# Patient Record
Sex: Female | Born: 1995 | Race: Black or African American | Hispanic: No | Marital: Married | Smoking: Never smoker
Health system: Southern US, Community
[De-identification: ages and names within clinical notes are randomized; demographics above are authoritative.]

## PROBLEM LIST (undated history)

## (undated) DIAGNOSIS — O210 Mild hyperemesis gravidarum: Secondary | ICD-10-CM

## (undated) HISTORY — PX: EYE SURGERY: SHX253

---

## 2018-01-23 ENCOUNTER — Emergency Department (HOSPITAL_BASED_OUTPATIENT_CLINIC_OR_DEPARTMENT_OTHER)
Admission: EM | Admit: 2018-01-23 | Discharge: 2018-01-23 | Disposition: A | Discharge status: Home / Self Care | Payer: Medicaid Other | Attending: Emergency Medicine | Admitting: Emergency Medicine

## 2018-01-23 ENCOUNTER — Encounter (HOSPITAL_BASED_OUTPATIENT_CLINIC_OR_DEPARTMENT_OTHER): Payer: Self-pay | Admitting: *Deleted

## 2018-01-23 ENCOUNTER — Other Ambulatory Visit: Payer: Self-pay

## 2018-01-23 DIAGNOSIS — O210 Mild hyperemesis gravidarum: Secondary | ICD-10-CM | POA: Diagnosis not present

## 2018-01-23 DIAGNOSIS — Z3A09 9 weeks gestation of pregnancy: Secondary | ICD-10-CM | POA: Insufficient documentation

## 2018-01-23 MED ORDER — ONDANSETRON 4 MG PO TBDP: ORAL_TABLET | ORAL | 0 refills | Status: AC

## 2018-01-23 MED ORDER — SODIUM CHLORIDE 0.9 % IV BOLUS
1000.0000 mL | Freq: Once | INTRAVENOUS | Status: AC
  Administered 2018-01-23: 1000 mL via INTRAVENOUS

## 2018-01-23 MED ORDER — SUCRALFATE 1 G PO TABS
1.0000 g | ORAL_TABLET | Freq: Once | ORAL | Status: AC
  Administered 2018-01-23: 1 g via ORAL
  Filled 2018-01-23: qty 1

## 2018-01-23 MED ORDER — DIPHENHYDRAMINE HCL 50 MG/ML IJ SOLN
25.0000 mg | Freq: Once | INTRAMUSCULAR | Status: AC
  Administered 2018-01-23: 25 mg via INTRAVENOUS
  Filled 2018-01-23: qty 1

## 2018-01-23 MED ORDER — METOCLOPRAMIDE HCL 5 MG/ML IJ SOLN
10.0000 mg | Freq: Once | INTRAMUSCULAR | Status: AC
  Administered 2018-01-23: 10 mg via INTRAVENOUS
  Filled 2018-01-23: qty 2

## 2018-01-23 MED ORDER — ONDANSETRON HCL 4 MG/2ML IJ SOLN
4.0000 mg | Freq: Once | INTRAMUSCULAR | Status: AC
  Administered 2018-01-23: 4 mg via INTRAVENOUS
  Filled 2018-01-23: qty 2

## 2018-01-23 NOTE — ED Triage Notes (Signed)
[redacted] weeks pregnant. Here with vomiting. No pain. She has had an US which showed a healthy implant.

## 2018-01-23 NOTE — Discharge Instructions (Signed)
Go to a drugstore and buy unisom and vitaminb6(pyridoxine) Take 1/2 a tab of unisom(12.5mg ) and 25mg  of vitamin b6.  Take this at night before bed.  If you continue to have nausea and vomiting take twice a day.  [facility] or [facility].  Try to eat small frequent meals.  Follow up with your OB.

## 2018-01-23 NOTE — ED Provider Notes (Signed)
[facility] EMERGENCY DEPARTMENT Provider Note   CSN: [id number] Arrival date & time: 01/23/18  2157     History   Chief Complaint Chief Complaint  Patient presents with  . Emesis  . [redacted] Weeks Pregnant    HPI Bethany Hart is a 22 y.o. female.  22 yoF with a chief complaint of nausea and vomiting.  The patient has had an issue with this for some time.  This is her seventh visit to a medical professional for this complaint during this pregnancy.  She is about [redacted] weeks pregnant now.  Had an IUP that was documented about 6 days ago.  She denies vaginal bleeding or discharge denies dysuria increased frequency or flank pain.  She feels that she cannot keep anything down.  When I questioned her about frequent meals she states she has not been on the keep anything down so has not tried frequent meals.  Her OB/GYN's notes that they prescribed her likely just asked her how this is working for her and she is that she has not yet had that filled.  The history is provided by the patient.  Emesis   This is a new problem. The current episode started more than 1 week ago. The problem occurs 2 to 4 times per day. The problem has not changed since onset.There has been no fever. Pertinent negatives include no arthralgias, no chills, no fever, no headaches and no myalgias.    History reviewed. No pertinent past medical history.  There are no active problems to display for this patient.   History reviewed. No pertinent surgical history.   OB History    Gravida  1   Para      Term      Preterm      AB      Living        SAB      TAB      Ectopic      Multiple      Live Births               Home Medications    Prior to Admission medications   Medication Sig Start Date End Date Taking? Authorizing Provider  ondansetron (ZOFRAN ODT) 4 MG disintegrating tablet 4mg  ODT q4 hours prn nausea/vomit 01/23/18   Melene PlanFloyd, Raghad Lorenz, DO    Family History No family history on  file.  Social History Social History   Tobacco Use  . Smoking status: Never Smoker  . Smokeless tobacco: Never Used  Substance Use Topics  . Alcohol use: Never    Frequency: Never  . Drug use: Never     Allergies   Patient has no known allergies.   Review of Systems Review of Systems  Constitutional: Negative for chills and fever.  HENT: Negative for congestion and rhinorrhea.   Eyes: Negative for redness and visual disturbance.  Respiratory: Negative for shortness of breath and wheezing.   Cardiovascular: Negative for chest pain and palpitations.  Gastrointestinal: Positive for nausea and vomiting.  Genitourinary: Negative for dysuria and urgency.  Musculoskeletal: Negative for arthralgias and myalgias.  Skin: Negative for pallor and wound.  Neurological: Negative for dizziness and headaches.     Physical Exam Updated Vital Signs BP 112/79 (BP Location: Left Arm)   Pulse 86   Temp 98.6 F (37 C) (Oral)   Resp 20   Ht 4\' 11"  (1.499 m)   LMP 11/21/2017   SpO2 100%   Physical Exam  Constitutional: She  is oriented to person, place, and time. She appears well-developed and well-nourished. No distress.  HENT:  Head: Normocephalic and atraumatic.  Eyes: Pupils are equal, round, and reactive to light. EOM are normal.  Neck: Normal range of motion. Neck supple.  Cardiovascular: Normal rate and regular rhythm. Exam reveals no gallop and no friction rub.  No murmur heard. Pulmonary/Chest: Effort normal. She has no wheezes. She has no rales.  Abdominal: Soft. She exhibits no distension and no mass. There is no tenderness. There is no guarding.  Musculoskeletal: She exhibits no edema or tenderness.  Neurological: She is alert and oriented to person, place, and time.  Skin: Skin is warm and dry. She is not diaphoretic.  Psychiatric: She has a normal mood and affect. Her behavior is normal.  Nursing note and vitals reviewed.    ED Treatments / Results  Labs (all  labs ordered are listed, but only abnormal results are displayed) Labs Reviewed - No data to display  EKG None  Radiology No results found.  Procedures Procedures (including critical care time)  Medications Ordered in ED Medications  sodium chloride 0.9 % bolus 1,000 mL ( Intravenous Stopped 01/23/18 2334)  metoCLOPramide (REGLAN) injection 10 mg (10 mg Intravenous Given 01/23/18 2234)  diphenhydrAMINE (BENADRYL) injection 25 mg (25 mg Intravenous Given 01/23/18 2236)  ondansetron (ZOFRAN) injection 4 mg (4 mg Intravenous Given 01/23/18 2305)  sucralfate (CARAFATE) tablet 1 g (1 g Oral Given 01/23/18 2327)     Initial Impression / Assessment and Plan / ED Course  I have reviewed the triage vital signs and the nursing notes.  Pertinent labs & imaging results that were available during my care of the patient were reviewed by me and considered in my medical decision making (see chart for details).     22 yo F with a chief complaint of hyperemesis gravidarum.  This is the patient's seventh visit for this.  We will give Reglan and Benadryl and IV fluids and reassess.  Nausea improved, tolerating PO.  OB follow up.   11:36 PM:  I have discussed the diagnosis/risks/treatment options with the patient and believe the pt to be eligible for discharge home to follow-up with OB. We also discussed returning to the ED immediately if new or worsening sx occur. We discussed the sx which are most concerning (e.g., inability to eat or drink) that necessitate immediate return. Medications administered to the patient during their visit and any new prescriptions provided to the patient are listed below.  Medications given during this visit Medications  sodium chloride 0.9 % bolus 1,000 mL ( Intravenous Stopped 01/23/18 2334)  metoCLOPramide (REGLAN) injection 10 mg (10 mg Intravenous Given 01/23/18 2234)  diphenhydrAMINE (BENADRYL) injection 25 mg (25 mg Intravenous Given 01/23/18 2236)  ondansetron  (ZOFRAN) injection 4 mg (4 mg Intravenous Given 01/23/18 2305)  sucralfate (CARAFATE) tablet 1 g (1 g Oral Given 01/23/18 2327)      The patient appears reasonably screen and/or stabilized for discharge and I doubt any other medical condition or other [facility] requiring further screening, evaluation, or treatment in the ED at this time prior to discharge.    Final Clinical Impressions(s) / ED Diagnoses   Final diagnoses:  Hyperemesis gravidarum    ED Discharge Orders         Ordered    ondansetron (ZOFRAN ODT) 4 MG disintegrating tablet     01/23/18 2334           Melene PlanFloyd, Chau Sawin, DO 01/23/18 2336

## 2018-02-06 ENCOUNTER — Emergency Department (HOSPITAL_BASED_OUTPATIENT_CLINIC_OR_DEPARTMENT_OTHER): Payer: Medicaid Other

## 2018-02-06 ENCOUNTER — Other Ambulatory Visit: Payer: Self-pay

## 2018-02-06 ENCOUNTER — Encounter (HOSPITAL_BASED_OUTPATIENT_CLINIC_OR_DEPARTMENT_OTHER): Payer: Self-pay | Admitting: *Deleted

## 2018-02-06 ENCOUNTER — Emergency Department (HOSPITAL_BASED_OUTPATIENT_CLINIC_OR_DEPARTMENT_OTHER)
Admission: EM | Admit: 2018-02-06 | Discharge: 2018-02-07 | Disposition: A | Discharge status: Home / Self Care | Payer: Medicaid Other | Attending: Emergency Medicine | Admitting: Emergency Medicine

## 2018-02-06 DIAGNOSIS — R1011 Right upper quadrant pain: Secondary | ICD-10-CM | POA: Diagnosis not present

## 2018-02-06 DIAGNOSIS — Z79899 Other long term (current) drug therapy: Secondary | ICD-10-CM | POA: Insufficient documentation

## 2018-02-06 DIAGNOSIS — R1013 Epigastric pain: Secondary | ICD-10-CM

## 2018-02-06 DIAGNOSIS — Z3A09 9 weeks gestation of pregnancy: Secondary | ICD-10-CM | POA: Diagnosis not present

## 2018-02-06 DIAGNOSIS — O26891 Other specified pregnancy related conditions, first trimester: Secondary | ICD-10-CM | POA: Insufficient documentation

## 2018-02-06 LAB — COMPREHENSIVE METABOLIC PANEL
ALBUMIN: 4.5 g/dL (ref 3.5–5.0)
ALT: 14 U/L (ref 0–44)
AST: 21 U/L (ref 15–41)
Alkaline Phosphatase: 56 U/L (ref 38–126)
Anion gap: 12 (ref 5–15)
BILIRUBIN TOTAL: 0.7 mg/dL (ref 0.3–1.2)
BUN: 10 mg/dL (ref 6–20)
CHLORIDE: 101 mmol/L (ref 98–111)
CO2: 22 mmol/L (ref 22–32)
CREATININE: 0.66 mg/dL (ref 0.44–1.00)
Calcium: 10 mg/dL (ref 8.9–10.3)
GFR calc Af Amer: >60 mL/min (ref >60)
GFR calc non Af Amer: >60 mL/min (ref >60)
GLUCOSE: 95 mg/dL (ref 70–99)
Potassium: 3.6 mmol/L (ref 3.5–5.1)
Sodium: 135 mmol/L (ref 135–145)
Total Protein: 8.4 g/dL — ABNORMAL HIGH (ref 6.5–8.1)

## 2018-02-06 LAB — URINALYSIS, ROUTINE W REFLEX MICROSCOPIC
BILIRUBIN URINE: NEGATIVE
Glucose, UA: NEGATIVE mg/dL
KETONES UR: 40 mg/dL — AB
Leukocytes, UA: NEGATIVE
NITRITE: NEGATIVE
Protein, ur: NEGATIVE mg/dL
Specific Gravity, Urine: >1.03 — ABNORMAL HIGH (ref 1.005–1.030)
pH: 6 (ref 5.0–8.0)

## 2018-02-06 LAB — CBC
HCT: 37 % (ref 36.0–46.0)
Hemoglobin: 13.1 g/dL (ref 12.0–15.0)
MCH: 29.5 pg (ref 26.0–34.0)
MCHC: 35.4 g/dL (ref 30.0–36.0)
MCV: 83.3 fL (ref 78.0–100.0)
PLATELETS: 337 10*3/uL (ref 150–400)
RBC: 4.44 MIL/uL (ref 3.87–5.11)
RDW: 12.1 % (ref 11.5–15.5)
WBC: 7.2 10*3/uL (ref 4.0–10.5)

## 2018-02-06 LAB — URINALYSIS, MICROSCOPIC (REFLEX)

## 2018-02-06 LAB — LIPASE, BLOOD: LIPASE: 36 U/L (ref 11–51)

## 2018-02-06 MED ORDER — SODIUM CHLORIDE 0.9 % IV BOLUS
1000.0000 mL | Freq: Once | INTRAVENOUS | Status: AC
Start: 2018-02-06 — End: 2018-02-06
  Administered 2018-02-06: 1000 mL via INTRAVENOUS

## 2018-02-06 MED ORDER — ONDANSETRON HCL 4 MG/2ML IJ SOLN
4.0000 mg | Freq: Once | INTRAMUSCULAR | Status: DC | PRN
  Filled 2018-02-06: qty 2

## 2018-02-06 MED ORDER — SUCRALFATE 1 G PO TABS: 1.0000 g | ORAL_TABLET | Freq: Three times a day (TID) | ORAL | 0 refills | Status: DC

## 2018-02-06 MED ORDER — LANSOPRAZOLE 15 MG PO CPDR: 15.0000 mg | DELAYED_RELEASE_CAPSULE | Freq: Every day | ORAL | 0 refills | Status: DC

## 2018-02-06 MED ORDER — SODIUM CHLORIDE 0.9 % IV BOLUS
1000.0000 mL | Freq: Once | INTRAVENOUS | Status: AC
  Administered 2018-02-06: 1000 mL via INTRAVENOUS

## 2018-02-06 MED ORDER — SUCRALFATE 1 G PO TABS
1.0000 g | ORAL_TABLET | Freq: Once | ORAL | Status: AC
  Administered 2018-02-06: 1 g via ORAL
  Filled 2018-02-06: qty 1

## 2018-02-06 NOTE — ED Triage Notes (Signed)
[redacted] weeks pregnant. Abdominal pain that feels like a pushing in her mid abdomen. Nausea. She has had hyperemesis with the pregnancy.

## 2018-02-06 NOTE — ED Provider Notes (Signed)
[facility] EMERGENCY DEPARTMENT Provider Note   CSN: [id number] Arrival date & time: 02/06/18  1842     History   Chief Complaint Chief Complaint  Patient presents with  . Abdominal Pain    HPI Bethany Hart is a 22 y.o. female.  HPI Bethany Hart is a 22 y.o. female G1, P0, 9 weeks and 3 days gestation by ultrasound yesterday, presents to emergency department complaining of epigastric pain, nausea, vomiting.  Patient states that her symptoms started few weeks ago.  She states that really got worse in the last several days.  She was seen yesterday at [facility], and was diagnosed with gastritis and started on Pepcid.  She did not take any Pepcid at home but had a dose in the hospital which helped.  She states that she is unable to keep anything down at home.  She states pain comes and goes, mainly in epigastric area but radiates into the right flank.  She denies any urinary symptoms.  No vaginal discharge or bleeding.  She had blood work done yesterday which was all normal as well as urinalysis and pelvic ultrasound which showed no problems with her pregnancy.  She states she feels dehydrated.  History reviewed. No pertinent past medical history.  There are no active problems to display for this patient.   Past Surgical History:  Procedure Laterality Date  . EYE SURGERY       OB History    Gravida  1   Para      Term      Preterm      AB      Living        SAB      TAB      Ectopic      Multiple      Live Births               Home Medications    Prior to Admission medications   Medication Sig Start Date End Date Taking? Authorizing Provider  ondansetron (ZOFRAN ODT) 4 MG disintegrating tablet 4mg  ODT q4 hours prn nausea/vomit 01/23/18   Melene Plan, DO    Family History No family history on file.  Social History Social History   Tobacco Use  . Smoking status: Never Smoker  . Smokeless tobacco: Never  Used  Substance Use Topics  . Alcohol use: Never    Frequency: Never  . Drug use: Never     Allergies   Patient has no known allergies.   Review of Systems Review of Systems  Constitutional: Negative for chills and fever.  Respiratory: Negative for cough, chest tightness and shortness of breath.   Cardiovascular: Negative for chest pain, palpitations and leg swelling.  Gastrointestinal: Positive for abdominal pain, nausea and vomiting. Negative for diarrhea.  Genitourinary: Negative for dysuria, flank pain, pelvic pain, vaginal bleeding, vaginal discharge and vaginal pain.  Musculoskeletal: Negative for arthralgias, myalgias, neck pain and neck stiffness.  Skin: Negative for rash.  Neurological: Negative for dizziness, weakness and headaches.  All other systems reviewed and are negative.    Physical Exam Updated Vital Signs BP 116/69 (BP Location: Right Arm)   Pulse (!) 109   Temp 98.4 F (36.9 C) (Oral)   Resp 20   Ht 4\' 11"  (1.499 m)   Wt 50.3 kg   LMP 11/21/2017   SpO2 100%   BMI 22.40 kg/m   Physical Exam  Constitutional: She appears well-developed and well-nourished. No distress.  HENT:  Head: Normocephalic.  Eyes: Conjunctivae are normal.  Neck: Neck supple.  Cardiovascular: Normal rate, regular rhythm and normal heart sounds.  Pulmonary/Chest: Effort normal and breath sounds normal. No respiratory distress. She has no wheezes. She has no rales.  Abdominal: Soft. Bowel sounds are normal. She exhibits no distension. There is tenderness. There is no rebound.  Epigastric and right upper quadrant tenderness  Musculoskeletal: She exhibits no edema.  Neurological: She is alert.  Skin: Skin is warm and dry.  Psychiatric: She has a normal mood and affect. Her behavior is normal.  Nursing note and vitals reviewed.    ED Treatments / Results  Labs (all labs ordered are listed, but only abnormal results are displayed) Labs Reviewed  URINALYSIS, ROUTINE W  REFLEX MICROSCOPIC - Abnormal; Notable for the following components:      Result Value   APPearance CLOUDY (*)    Specific Gravity, Urine >1.030 (*)    Hgb urine dipstick LARGE (*)    Ketones, ur 40 (*)    All other components within normal limits  URINALYSIS, MICROSCOPIC (REFLEX) - Abnormal; Notable for the following components:   Bacteria, UA RARE (*)    All other components within normal limits  COMPREHENSIVE METABOLIC PANEL - Abnormal; Notable for the following components:   Total Protein 8.4 (*)    All other components within normal limits  LIPASE, BLOOD  CBC    EKG None  Radiology US Abdomen Limited Ruq  Result Date: 02/06/2018 CLINICAL DATA:  Right upper quadrant pain, nausea, vomiting EXAM: ULTRASOUND ABDOMEN LIMITED RIGHT UPPER QUADRANT COMPARISON:  None. FINDINGS: Gallbladder: No gallstones or wall thickening visualized. No sonographic Murphy sign noted by sonographer. Common bile duct: Diameter: Normal caliber, 1 mm Liver: No focal lesion identified. Within normal limits in parenchymal echogenicity. Portal vein is patent on color Doppler imaging with normal direction of blood flow towards the liver. IMPRESSION: Normal right upper quadrant ultrasound. Electronically Signed   By: Charlett Nose M.D.   On: 02/06/2018 23:16    Procedures Procedures (including critical care time)  Medications Ordered in ED Medications  ondansetron (ZOFRAN) injection 4 mg (has no administration in time range)  sodium chloride 0.9 % bolus 1,000 mL (has no administration in time range)  sodium chloride 0.9 % bolus 1,000 mL (has no administration in time range)     Initial Impression / Assessment and Plan / ED Course  I have reviewed the triage vital signs and the nursing notes.  Pertinent labs & imaging results that were available during my care of the patient were reviewed by me and considered in my medical decision making (see chart for details).     Patient with persistent nausea  vomiting and epigastric pain.  She is 9 weeks and 3 days pregnant.  Had ultrasound and blood work done yesterday which is all unremarkable.  Blood work repeated here today in triage and is negative as well including LFTs and lipase.  She is very tender in the right upper quadrant, will get ultrasound of her gallbladder today.  I will give her IV fluids.  Her urine does look concentrated with 40 ketones.  Her urine however does not show any signs of infection.  11:49 PM Ultrasound the gallbladder is negative.  Patient is feeling better.  She is drinking with no difficulty.  She is still having epigastric pain.  I tried Carafate which did not help.  Was started on ranitidine by [facility] yesterday which she states when  she took only helped for about an hour.  I will add Prevacid and Carafate to her medicines at home.  She has Zofran that she has been taking it helps her with vomiting.  We discussed food choices.  Discussed following up with primary care doctor.  Instructed to her to come back if she is feeling worse.  Patient agreed.  At this time I do not think she needs admission.  She is drinking.  She is in no acute distress, vital signs are normal, stable for discharge home.  Vitals:   02/06/18 1849 02/06/18 1852 02/06/18 2057 02/06/18 2337  BP:  (!) 116/104 116/69 112/70  Pulse:  (!) 120 (!) 109 95  Resp:  18 20 16   Temp:  98.4 F (36.9 C)    TempSrc:  Oral    SpO2:  99% 100% 100%  Weight: 50.3 kg     Height: 4\' 11"  (1.499 m)        Final Clinical Impressions(s) / ED Diagnoses   Final diagnoses:  RUQ pain  Epigastric pain    ED Discharge Orders         Ordered    sucralfate (CARAFATE) 1 g tablet  3 times daily with meals & bedtime     02/06/18 2352    lansoprazole (PREVACID) 15 MG capsule  Daily     02/06/18 2352           Jaynie CrumbleKirichenko, Alexyss Balzarini, PA-C 02/06/18 2356    Vanetta MuldersZackowski, Scott, MD 02/12/18 [id number]

## 2018-02-06 NOTE — Discharge Instructions (Addendum)
Continue ranitidine that was prescribed to you by [facility], take Prevacid and Carafate in addition.  You can take Maalox for acute symptoms.  Avoid any spicy or tomato-based foods.  Follow-up with OB/GYN if not improving. Return if worsening symptoms.

## 2019-09-16 IMAGING — US US ABDOMEN LIMITED
1 series · 14 of 25 positions shown · non-contrast
Comparison: None.

CLINICAL DATA: Right upper quadrant pain, nausea, vomiting

EXAM:
ULTRASOUND ABDOMEN LIMITED RIGHT UPPER QUADRANT

[Series 1: us abdomen limited · 0.14mm/px · 14 of 40 slices shown]
[im 1/40]
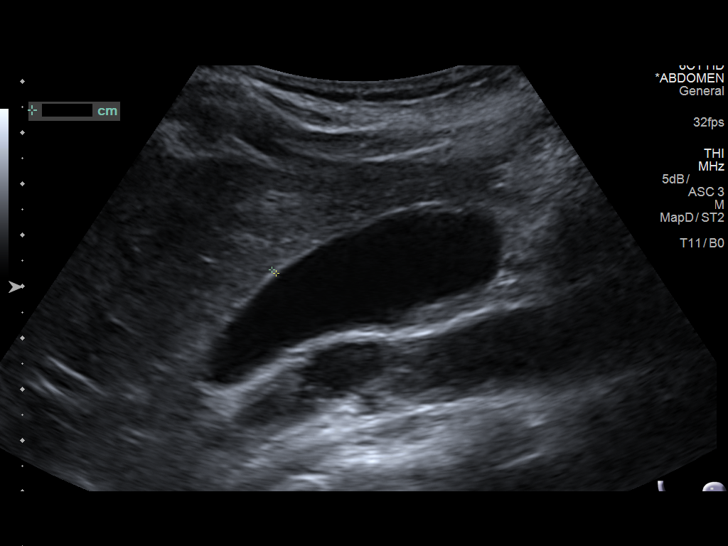
[im 4/40]
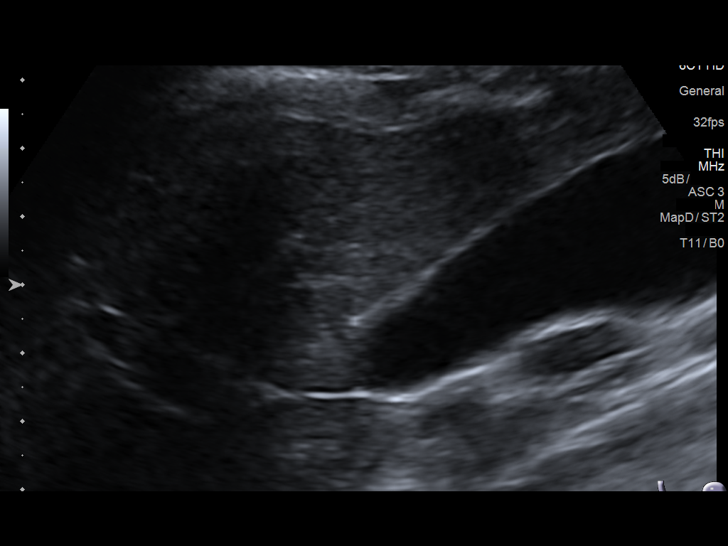
[im 7/40]
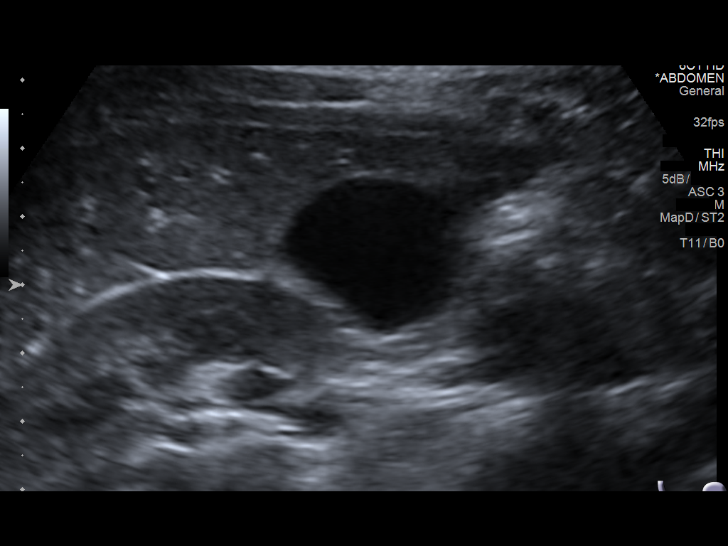
[im 10/40]
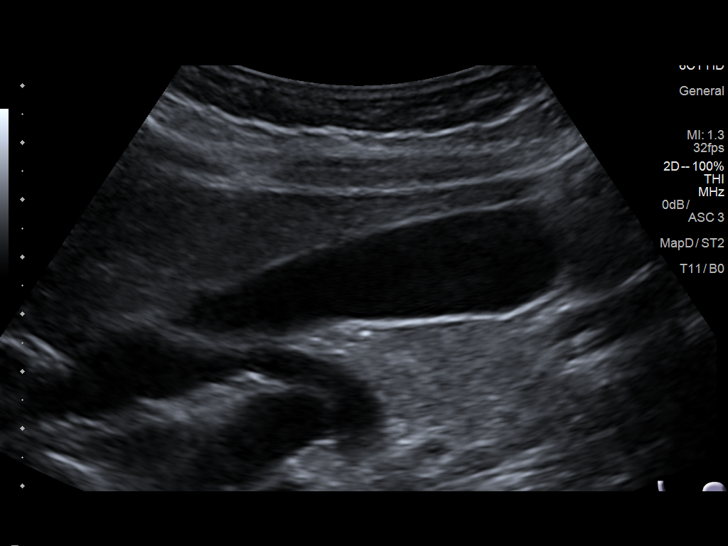
[im 14/40]
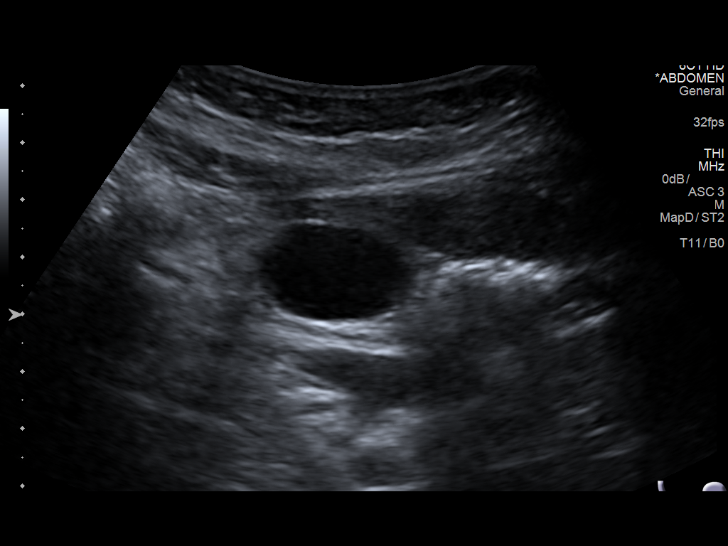
[im 15/40]
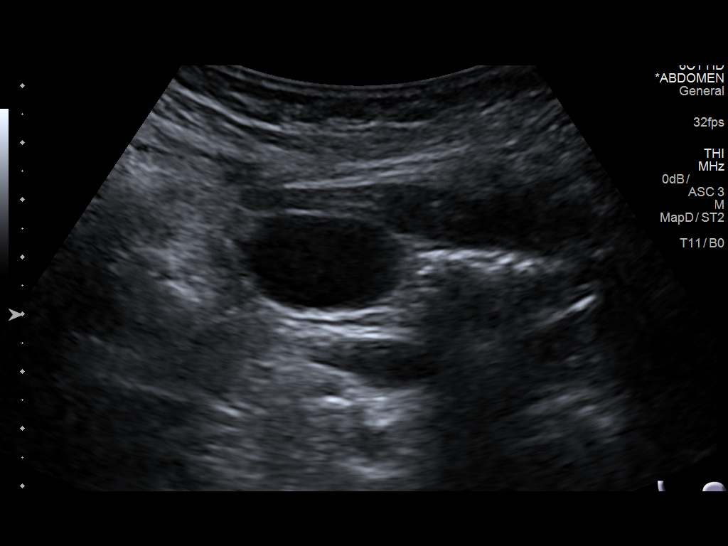
[im 18/40]
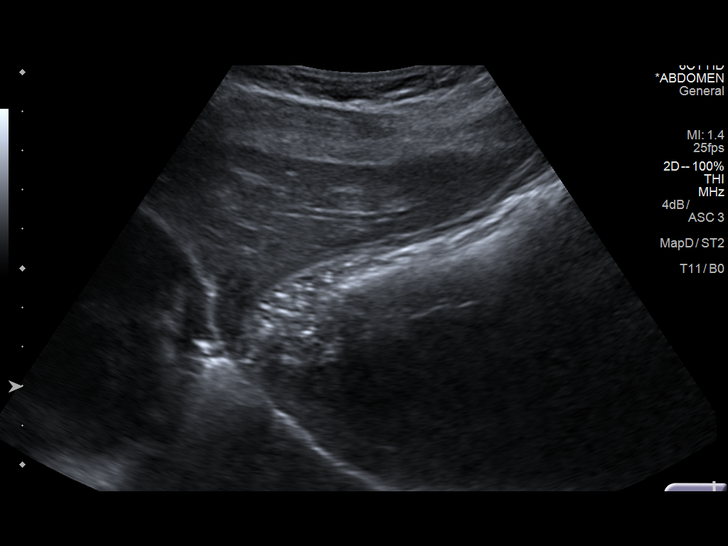
[im 22/40]
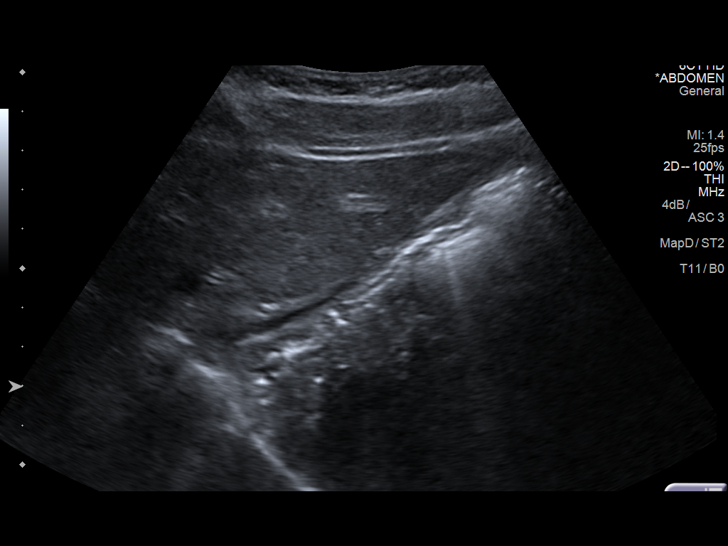
[im 25/40]
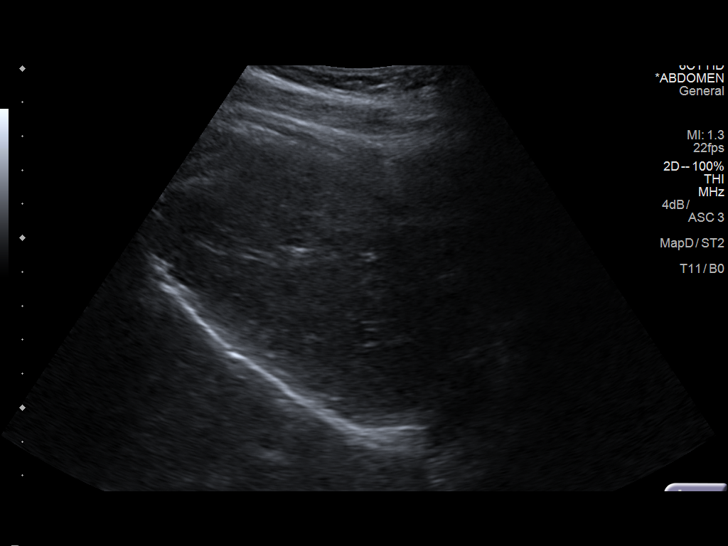
[im 27/40]
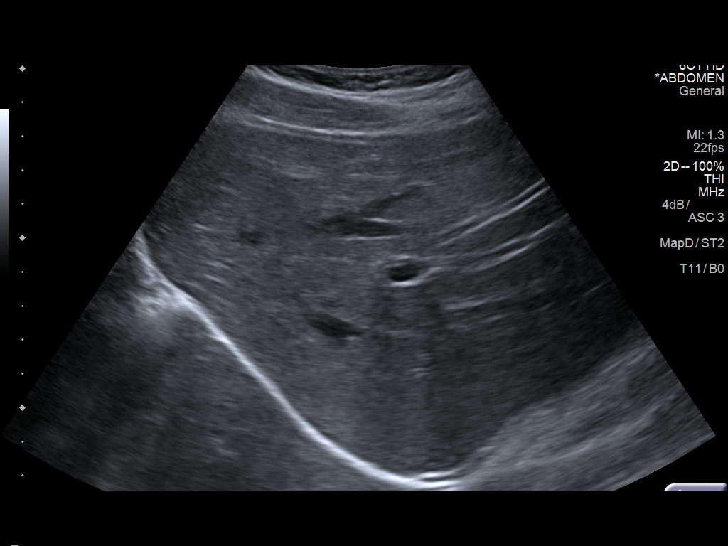
[im 30/40]
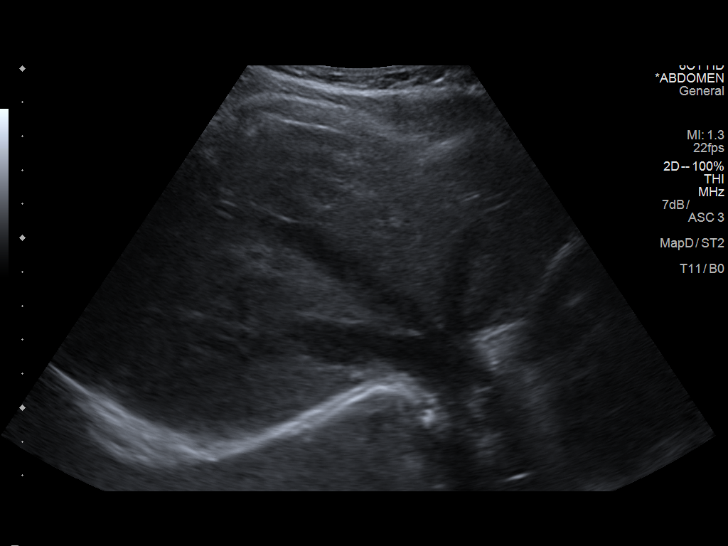
[im 33/40]
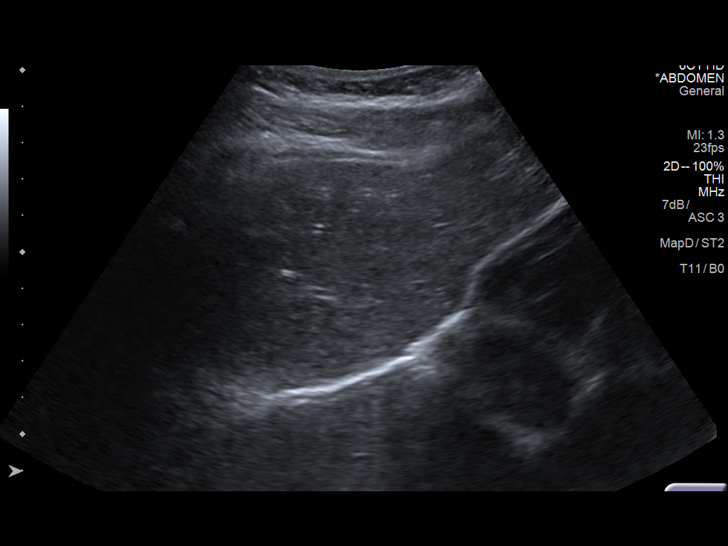
[im 36/40]
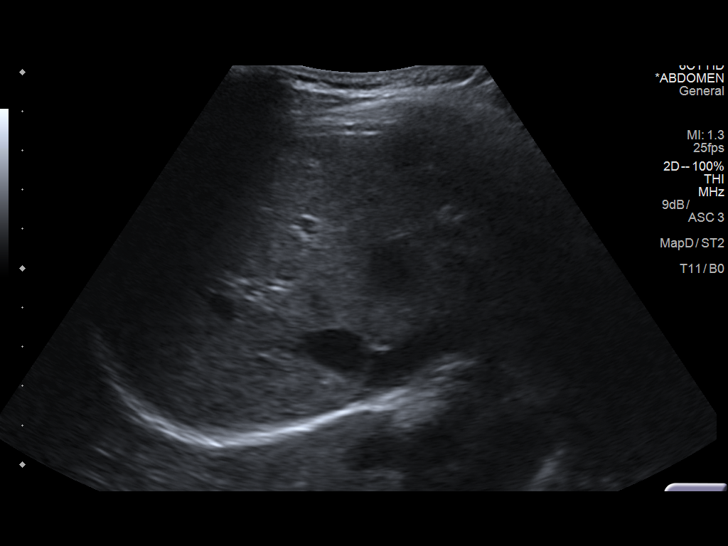
[im 40/40]
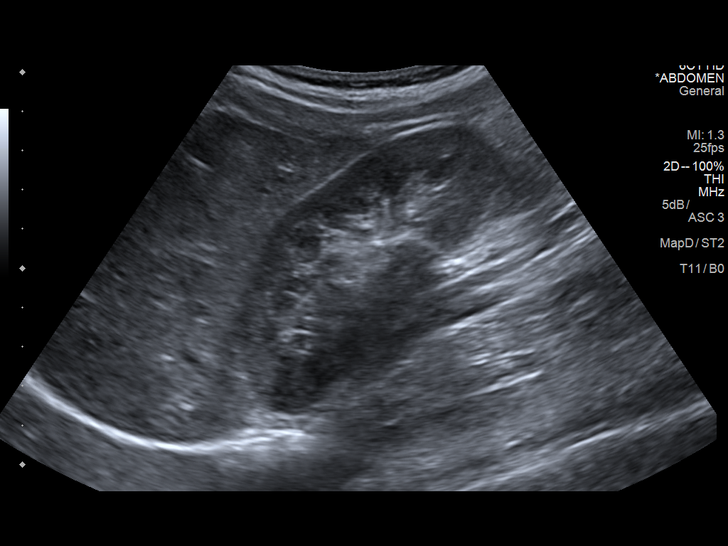

[14 of 25 positions shown; findings below may reference images not displayed]

FINDINGS: Gallbladder:

No gallstones or wall thickening visualized. No sonographic Murphy
sign noted by sonographer.

Common bile duct:

Diameter: Normal caliber, 1 mm

Liver:

No focal lesion identified. Within normal limits in parenchymal
echogenicity. Portal vein is patent on color Doppler imaging with
normal direction of blood flow towards the liver.
IMPRESSION: Normal right upper quadrant ultrasound.

## 2020-01-07 ENCOUNTER — Encounter (HOSPITAL_BASED_OUTPATIENT_CLINIC_OR_DEPARTMENT_OTHER): Payer: Self-pay | Admitting: Emergency Medicine

## 2020-01-07 ENCOUNTER — Emergency Department (HOSPITAL_BASED_OUTPATIENT_CLINIC_OR_DEPARTMENT_OTHER): Payer: Medicaid Other

## 2020-01-07 ENCOUNTER — Other Ambulatory Visit: Payer: Self-pay

## 2020-01-07 ENCOUNTER — Emergency Department (HOSPITAL_BASED_OUTPATIENT_CLINIC_OR_DEPARTMENT_OTHER)
Admission: EM | Admit: 2020-01-07 | Discharge: 2020-01-07 | Disposition: A | Discharge status: Home / Self Care | Payer: Medicaid Other | Attending: Emergency Medicine | Admitting: Emergency Medicine

## 2020-01-07 DIAGNOSIS — Z3A14 14 weeks gestation of pregnancy: Secondary | ICD-10-CM | POA: Diagnosis not present

## 2020-01-07 DIAGNOSIS — O26899 Other specified pregnancy related conditions, unspecified trimester: Secondary | ICD-10-CM | POA: Diagnosis not present

## 2020-01-07 DIAGNOSIS — O219 Vomiting of pregnancy, unspecified: Secondary | ICD-10-CM | POA: Diagnosis not present

## 2020-01-07 DIAGNOSIS — O209 Hemorrhage in early pregnancy, unspecified: Secondary | ICD-10-CM | POA: Diagnosis not present

## 2020-01-07 DIAGNOSIS — N946 Dysmenorrhea, unspecified: Secondary | ICD-10-CM | POA: Diagnosis not present

## 2020-01-07 DIAGNOSIS — A499 Bacterial infection, unspecified: Secondary | ICD-10-CM | POA: Insufficient documentation

## 2020-01-07 DIAGNOSIS — O4690 Antepartum hemorrhage, unspecified, unspecified trimester: Secondary | ICD-10-CM

## 2020-01-07 HISTORY — DX: Mild hyperemesis gravidarum: O21.0

## 2020-01-07 LAB — URINALYSIS, ROUTINE W REFLEX MICROSCOPIC
Bilirubin Urine: NEGATIVE
Glucose, UA: NEGATIVE mg/dL
Ketones, ur: NEGATIVE mg/dL
Nitrite: NEGATIVE
Protein, ur: NEGATIVE mg/dL
Specific Gravity, Urine: 1.02 (ref 1.005–1.030)
pH: 8.5 — ABNORMAL HIGH (ref 5.0–8.0)

## 2020-01-07 LAB — URINALYSIS, MICROSCOPIC (REFLEX)

## 2020-01-07 NOTE — ED Triage Notes (Signed)
Pt sts she is [redacted] wks pregnant.  Started having cramping and vaginal bleeding with clots this morning. Second pregnancy. First had normal course and Cesarean.

## 2020-01-07 NOTE — ED Provider Notes (Signed)
[facility] EMERGENCY DEPARTMENT Provider Note   CSN: [id number] Arrival date & time: 01/07/20  1206     History Chief Complaint  Patient presents with  . Vaginal Bleeding    Bethany Hart is a 24 y.o. female.  HPI Patient reports that she has been having some pink-tinged vaginal discharge and also seen a few clots of blood passed vaginally.  Patient is [redacted] weeks gestation pregnancy.  She reports this discharge and occasional spotting has been going on for a number of weeks.  She saw her OB/GYN doctor 4 days ago.  She reports had reassured her that spotting and pregnancy was normal.  She reports they did do a pelvic exam and told her she had a bit of a bacterial infection and prescribed her an antibiotic to take twice a day.  She reports she has not started that yet.  He is not having abdominal pain.  She reports she occasionally feels some cramps similar to a mild menstrual cramp.  No sharp pains or severe pains.  No pain in the back.  She reports she has had hyperemesis gravidarum during this pregnancy.  She reports she had in her prior pregnancy as well.  She reports her symptoms seem to be improving quite a bit.  She reports at this point are pretty well controlled by eating regularly.  She threw up once this morning but she reports that not atypical.  No diarrhea.  No pain burning with urination.    Past Medical History:  Diagnosis Date  . Hyperemesis gravidarum     There are no problems to display for this patient.   Past Surgical History:  Procedure Laterality Date  . CESAREAN SECTION    . EYE SURGERY       OB History    Gravida  1   Para      Term      Preterm      AB      Living        SAB      TAB      Ectopic      Multiple      Live Births              No family history on file.  Social History   Tobacco Use  . Smoking status: Never Smoker  . Smokeless tobacco: Never Used  Substance Use Topics  . Alcohol use: Never  .  Drug use: Never    Home Medications Prior to Admission medications   Medication Sig Start Date End Date Taking? Authorizing Provider  ondansetron (ZOFRAN ODT) 4 MG disintegrating tablet 4mg  ODT q4 hours prn nausea/vomit Patient taking differently: Take 4 mg by mouth every 4 (four) hours as needed for nausea or vomiting.  01/23/18  Yes 03/25/18, DO    Allergies    Patient has no known allergies.  Review of Systems   Review of Systems 10 systems reviewed and negative except as per HPI Physical Exam Updated Vital Signs BP 105/68 (BP Location: Right Arm)   Pulse 71   Temp 98.6 F (37 C) (Oral)   Resp 20   Ht 4\' 11"  (1.499 m)   Wt 47.4 kg   SpO2 100%   BMI 21.11 kg/m   Physical Exam Constitutional:      Comments: Alert nontoxic well in appearance.  HENT:     Head: Normocephalic and atraumatic.  Eyes:     Extraocular Movements: Extraocular movements intact.  Conjunctiva/sclera: Conjunctivae normal.  Cardiovascular:     Rate and Rhythm: Normal rate and regular rhythm.  Pulmonary:     Effort: Pulmonary effort is normal.     Breath sounds: Normal breath sounds.  Abdominal:     Comments: Abdomen is soft.  Gravid uterus.  Nontender palpation.  No guarding.  Musculoskeletal:        General: No swelling or tenderness. Normal range of motion.     Right lower leg: No edema.     Left lower leg: No edema.  Skin:    General: Skin is warm and dry.  Neurological:     General: No focal deficit present.     Mental Status: She is oriented to person, place, and time.     Coordination: Coordination normal.  Psychiatric:        Mood and Affect: Mood normal.     ED Results / Procedures / Treatments   Labs (all labs ordered are listed, but only abnormal results are displayed) Labs Reviewed  URINALYSIS, ROUTINE W REFLEX MICROSCOPIC - Abnormal; Notable for the following components:      Result Value   APPearance CLOUDY (*)    pH 8.5 (*)    Hgb urine dipstick MODERATE (*)     Leukocytes,Ua TRACE (*)    All other components within normal limits  URINALYSIS, MICROSCOPIC (REFLEX) - Abnormal; Notable for the following components:   Bacteria, UA MANY (*)    All other components within normal limits    EKG None  Radiology US OB Limited  Result Date: 01/07/2020 CLINICAL DATA:  24 year old pregnant female with bleeding. LMP: 10/05/2019 for splenic to an estimated gestational age [redacted] weeks, 3 days. EXAM: LIMITED OBSTETRIC ULTRASOUND FINDINGS: Number of Fetuses: 1 Heart Rate:  143 bpm Movement: Detected Presentation: Variable Placental Location: Right lateral Previa: No Amniotic Fluid (Subjective):  Within normal limits. BPD: 2.5 cm 14 w  2 d MATERNAL FINDINGS: Cervix:  Appears closed. Uterus/Adnexae: No abnormality visualized. IMPRESSION: Single live intrauterine pregnancy with an estimated gestational age of [redacted] weeks, 2 days based on BPD. No acute findings. This exam is performed on an emergent basis and does not comprehensively evaluate fetal size, dating, or anatomy; follow-up complete OB US should be considered if further fetal assessment is warranted. Electronically Signed   By: Elgie Collard M.D.   On: 01/07/2020 15:37    Procedures Procedures (including critical care time)  Medications Ordered in ED Medications - No data to display  ED Course  I have reviewed the triage vital signs and the nursing notes.  Pertinent labs & imaging results that were available during my care of the patient were reviewed by me and considered in my medical decision making (see chart for details).    MDM Rules/Calculators/A&P                          Patient has been experiencing some spotting and light bleeding in pregnancy.  This has been evaluated by the patient's GYN provider with a pelvic exam done within the past 5 days.  Patient does not have severe pain, fever or other associated significant symptoms.  Ultrasound in ED today shows 14-week 2 gestation fetus with heart  tones and no other immediate complications.  Patient has had hyperemesis gravidarum in this pregnancy but she reports symptoms have been improving significantly as the pregnancy has progressed.  She had one episode of emesis this morning but reports that she is  hungry and would like to have a snack and something to drink now.  Patient is under the care of OB and today's ultrasound does not show any concerning findings.  Stable for discharge.  Return precautions reviewed. Final Clinical Impression(s) / ED Diagnoses Final diagnoses:  Vaginal bleeding in pregnancy    Rx / DC Orders ED Discharge Orders    None       Arby Barrette, MD 01/07/20 [phone]

## 2020-01-07 NOTE — ED Notes (Signed)
ED Provider at bedside. 

## 2020-01-07 NOTE — Discharge Instructions (Signed)
1.  Follow-up with your obstetrician in the next week. 2.  Return to the emergency department if you develop increasing pain, increasing bleeding or other concerning symptoms. 3.  Continue healthy diet and activities for early pregnancy.  Your ultrasound shows a 14-week 2-day gestation without other identified complications at this time.

## 2021-01-10 IMAGING — US US OB LIMITED
1 series · 14 of 16 positions shown · non-contrast
Comparison: none

CLINICAL DATA: 24-year-old pregnant female with bleeding. LMP:
10/05/2019 for splenic to an estimated gestational age 13 weeks, 3
days.

EXAM:
LIMITED OBSTETRIC ULTRASOUND

[Series 1: us ob limited · 16 acquisitions, 14 frames shown]
[im 1/16]
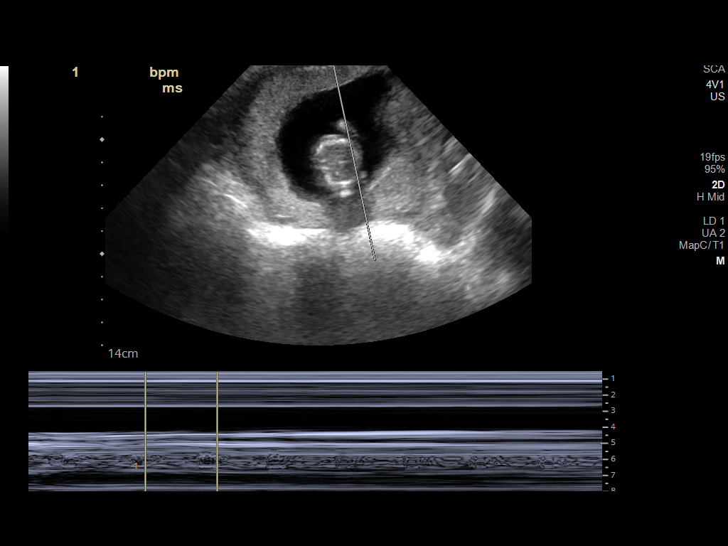
[im 2/16]
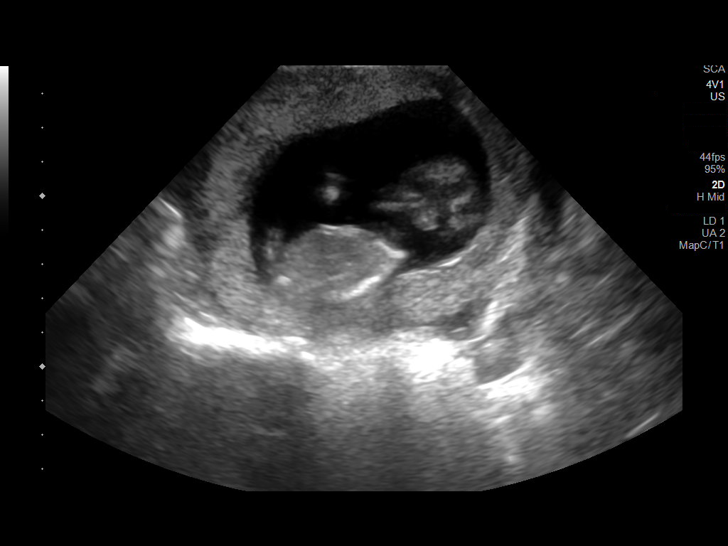
[im 3/16]
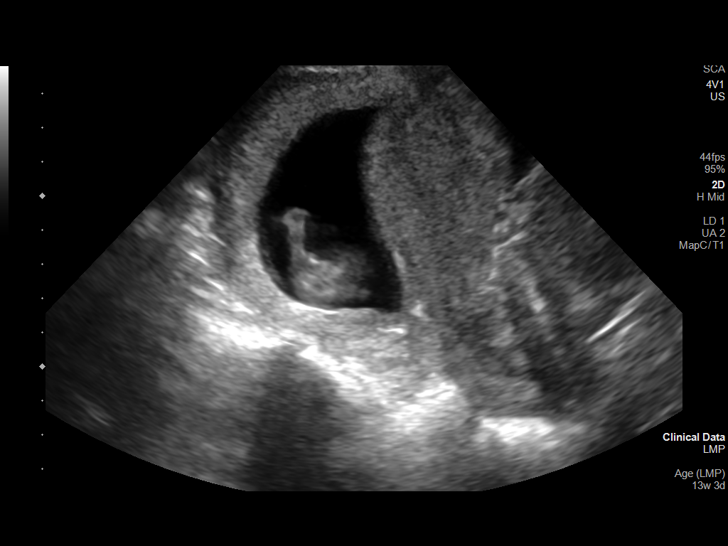
[im 5/16]
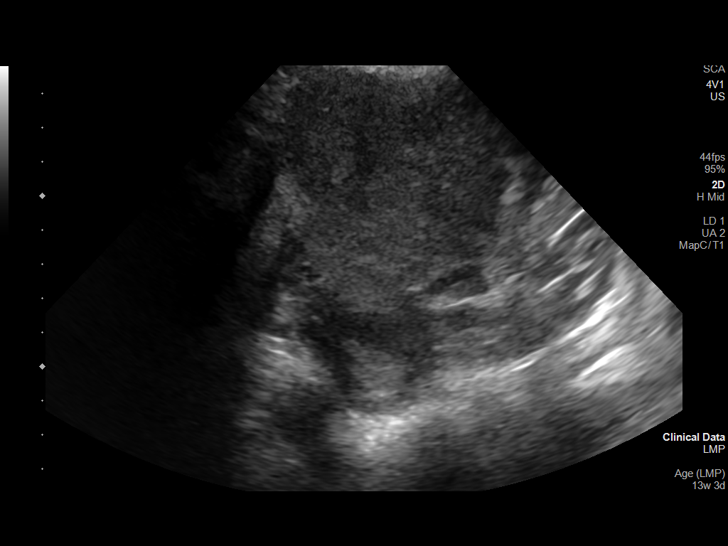
[im 6/16]
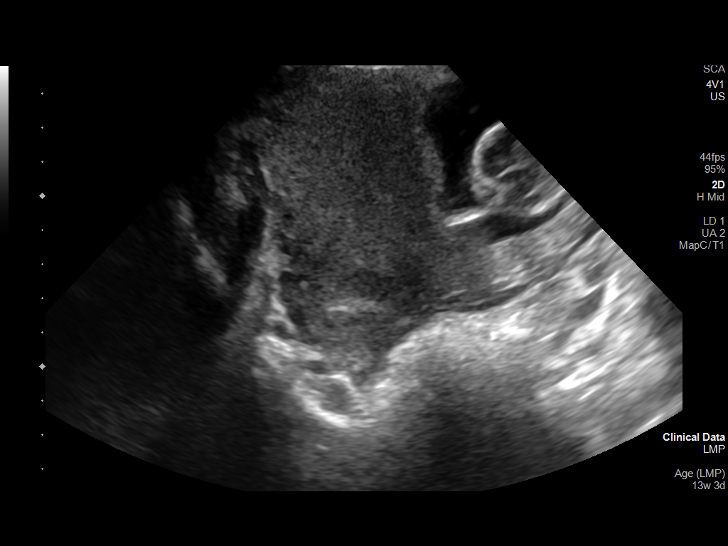
[im 7/16]
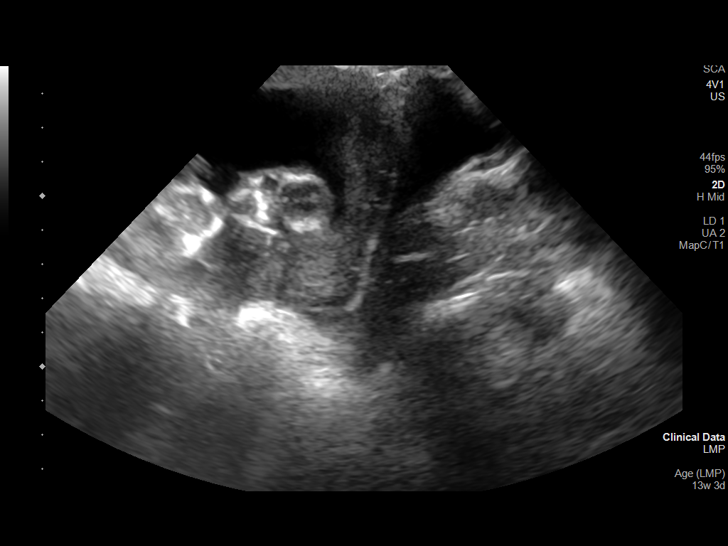
[im 8/16]
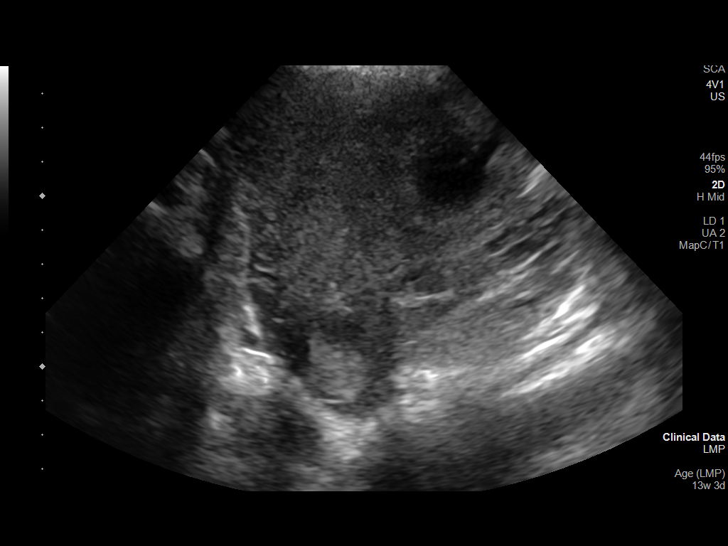
[im 9/16]
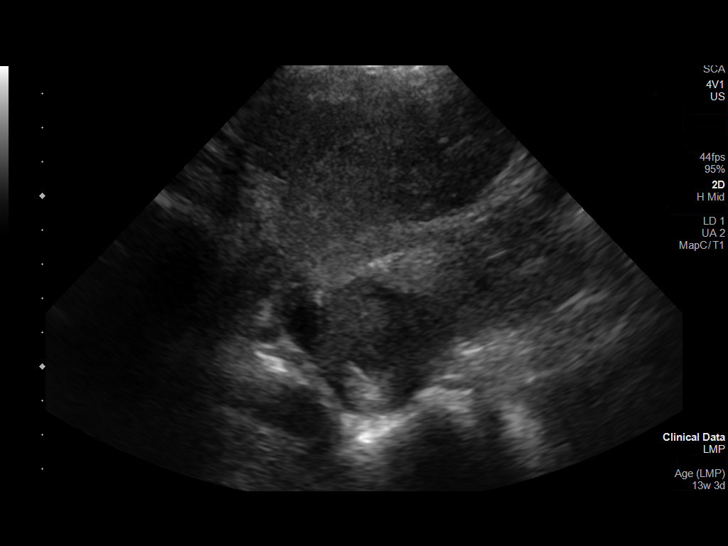
[im 10/16]
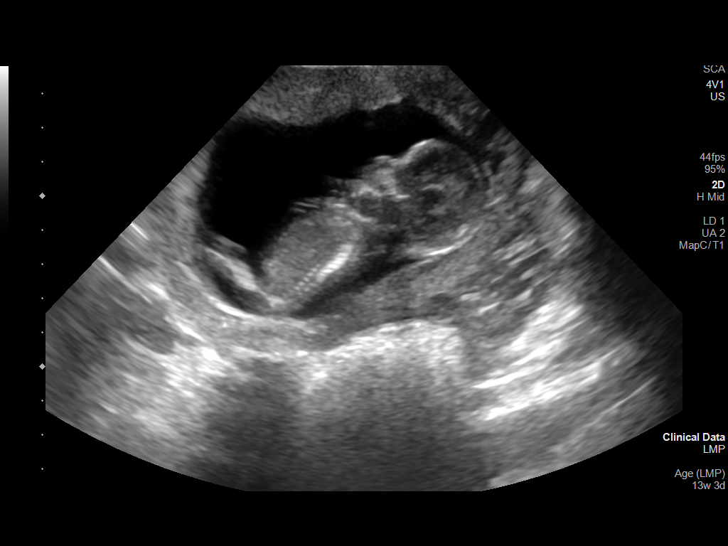
[im 11/16]
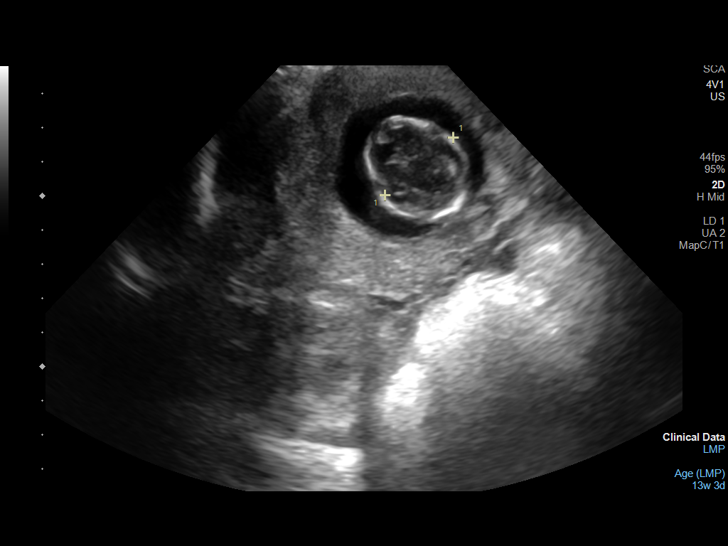
[im 13/16]
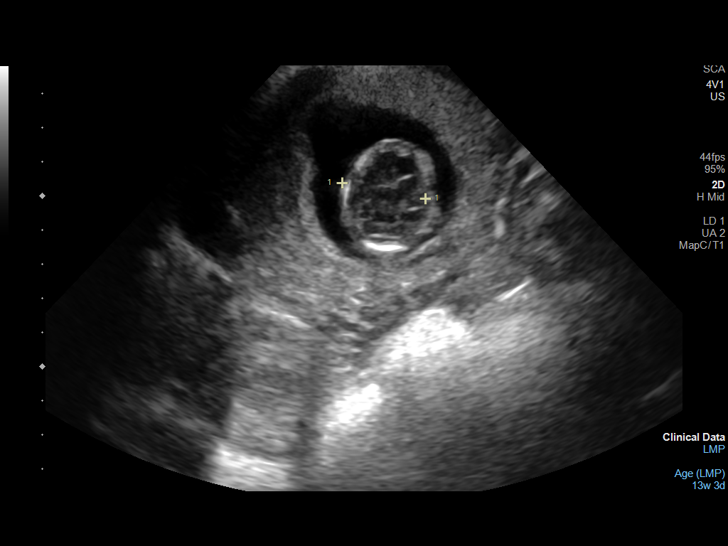
[im 14/16]
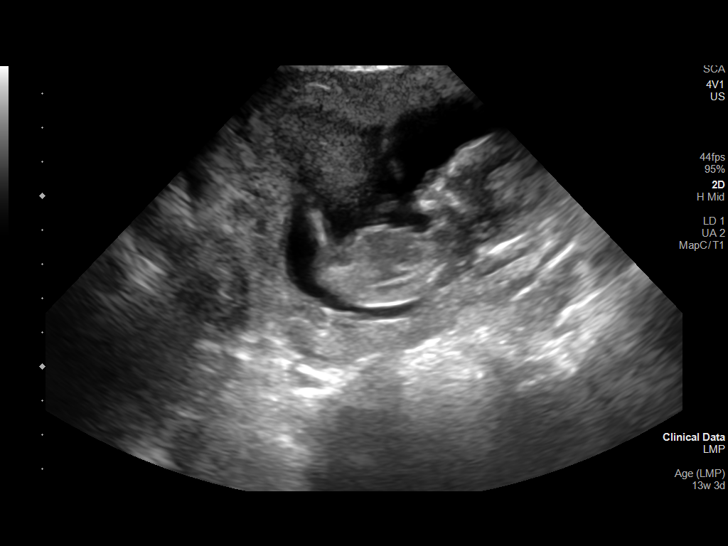
[im 15/16]
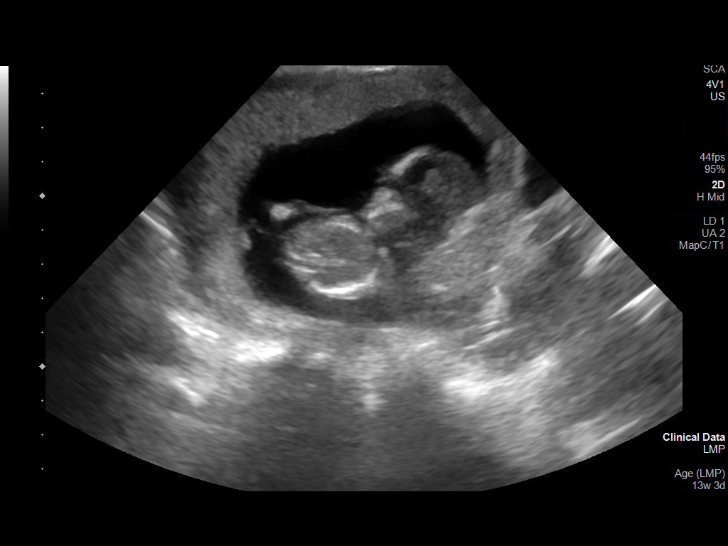
[im 16/16]
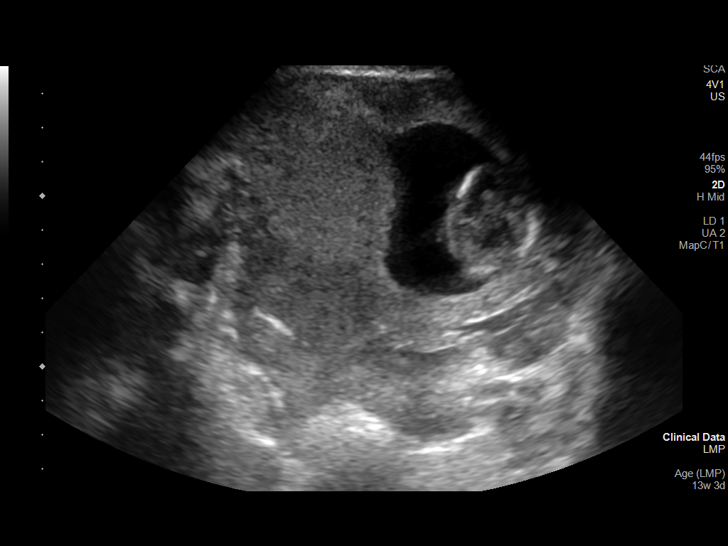

[14 of 16 positions shown; findings below may reference images not displayed]

FINDINGS: Number of Fetuses: 1

Heart Rate:  143 bpm

Movement: Detected

Presentation: Variable

Placental Location: Right lateral

Previa: No

Amniotic Fluid (Subjective):  Within normal limits.

BPD: 2.5 cm 14 w  2 d

MATERNAL FINDINGS:

Cervix:  Appears closed.

Uterus/Adnexae: No abnormality visualized.
IMPRESSION: Single live intrauterine pregnancy with an estimated gestational age
of 14 weeks, 2 days based on BPD. No acute findings.

This exam is performed on an emergent basis and does not
comprehensively evaluate fetal size, dating, or anatomy; follow-up
complete OB US should be considered if further fetal assessment is
warranted.
